# Patient Record
Sex: Female | Born: 1950 | Race: White | Hispanic: No | Smoking: Never smoker
Health system: Southern US, Community
[De-identification: ages and names within clinical notes are randomized; demographics above are authoritative.]

## PROBLEM LIST (undated history)

## (undated) DIAGNOSIS — I10 Essential (primary) hypertension: Secondary | ICD-10-CM

## (undated) DIAGNOSIS — E7800 Pure hypercholesterolemia, unspecified: Secondary | ICD-10-CM

---

## 2000-05-05 ENCOUNTER — Ambulatory Visit (HOSPITAL_COMMUNITY): Admission: RE | Admit: 2000-05-05 | Discharge: 2000-05-05 | Payer: Self-pay | Admitting: Internal Medicine

## 2000-05-05 ENCOUNTER — Encounter: Payer: Self-pay | Admitting: Internal Medicine

## 2020-06-07 ENCOUNTER — Emergency Department (HOSPITAL_BASED_OUTPATIENT_CLINIC_OR_DEPARTMENT_OTHER)
Admission: EM | Admit: 2020-06-07 | Discharge: 2020-06-08 | Disposition: A | Discharge status: Home / Self Care | Payer: Worker's Compensation | Attending: Emergency Medicine | Admitting: Emergency Medicine

## 2020-06-07 ENCOUNTER — Other Ambulatory Visit: Payer: Self-pay

## 2020-06-07 ENCOUNTER — Emergency Department (HOSPITAL_BASED_OUTPATIENT_CLINIC_OR_DEPARTMENT_OTHER): Payer: Worker's Compensation

## 2020-06-07 ENCOUNTER — Encounter (HOSPITAL_BASED_OUTPATIENT_CLINIC_OR_DEPARTMENT_OTHER): Payer: Self-pay | Admitting: *Deleted

## 2020-06-07 DIAGNOSIS — I10 Essential (primary) hypertension: Secondary | ICD-10-CM | POA: Diagnosis not present

## 2020-06-07 DIAGNOSIS — Y9301 Activity, walking, marching and hiking: Secondary | ICD-10-CM | POA: Insufficient documentation

## 2020-06-07 DIAGNOSIS — W010XXA Fall on same level from slipping, tripping and stumbling without subsequent striking against object, initial encounter: Secondary | ICD-10-CM | POA: Insufficient documentation

## 2020-06-07 DIAGNOSIS — S42211A Unspecified displaced fracture of surgical neck of right humerus, initial encounter for closed fracture: Secondary | ICD-10-CM | POA: Diagnosis not present

## 2020-06-07 DIAGNOSIS — Y990 Civilian activity done for income or pay: Secondary | ICD-10-CM | POA: Insufficient documentation

## 2020-06-07 DIAGNOSIS — S4991XA Unspecified injury of right shoulder and upper arm, initial encounter: Secondary | ICD-10-CM | POA: Diagnosis present

## 2020-06-07 HISTORY — DX: Essential (primary) hypertension: I10

## 2020-06-07 HISTORY — DX: Pure hypercholesterolemia, unspecified: E78.00

## 2020-06-07 MED ORDER — OXYCODONE-ACETAMINOPHEN 5-325 MG PO TABS
1.0000 | ORAL_TABLET | Freq: Once | ORAL | Status: AC
  Administered 2020-06-07: 1 via ORAL
  Filled 2020-06-07: qty 1

## 2020-06-07 MED ORDER — ONDANSETRON 4 MG PO TBDP
4.0000 mg | ORAL_TABLET | Freq: Once | ORAL | Status: AC
  Administered 2020-06-07: 4 mg via ORAL
  Filled 2020-06-07: qty 1

## 2020-06-07 NOTE — Discharge Instructions (Signed)
You can take Tylenol or Ibuprofen as directed for pain. You can alternate Tylenol and Ibuprofen every 4 hours. If you take Tylenol at 1pm, then you can take Ibuprofen at 5pm. Then you can take Tylenol again at 9pm.   Take pain medications as directed for break through pain. Do not drive or operate machinery while taking this medication.   Take zofran for nausea.  Follow up with the referred orthopedic doctor.   Return to the Emergency Dept for any worsening pain, numbness/weakness, discoloration or any other worsening or concerning symptoms.

## 2020-06-07 NOTE — ED Triage Notes (Signed)
Pt reports she works for [employer] and tripped on a furniture riser this evening and fell on her right arm. C/o pain in right wrist, elbow and shoulder. States EMS evaluated her but she came to ED by POV. Alert, able to wiggle fingers on right hand, + radial pulses present

## 2020-06-07 NOTE — ED Provider Notes (Signed)
[facility] EMERGENCY DEPARTMENT Provider Note   CSN: [id number] Arrival date & time: 06/07/20  2208     History Chief Complaint  Patient presents with  . Fall    Caitlin Torres is a 70 y.o. female past medical history of high cholesterol, hypertension who presents for evaluation of right upper extremity pain after mechanical fall that occurred earlier this evening.  Patient reports she was at work and states that she was walking out and tripped over a platform and states that she fell, landing on her right arm.  She states she denies any preceding chest pain or dizziness that caused her to fall.  She did not hit her head.  She denies any LOC.  She is not on any blood thinners.  She reports since then, she has had pain noted mainly to her right elbow, right wrist but states it radiates to her wrist, shoulder.  She has difficulty moving secondary to pain.  No numbness.  She has been able to ambulate since this happened.  She denies any neck pain, vision changes, nausea/vomiting, numbness/weakness.  The history is provided by the patient.       Past Medical History:  Diagnosis Date  . High cholesterol   . Hypertension     There are no problems to display for this patient.    The histories are not reviewed yet. Please review them in the "History" navigator section and refresh this SmartLink.   OB History   No obstetric history on file.     No family history on file.  Social History   Tobacco Use  . Smoking status: Never Smoker  . Smokeless tobacco: Never Used  Substance Use Topics  . Alcohol use: Not Currently    Comment: occas  . Drug use: Never    Home Medications Prior to Admission medications   Medication Sig Start Date End Date Taking? Authorizing Provider  HYDROcodone-acetaminophen (NORCO/VICODIN) 5-325 MG tablet Take 1-2 tablets by mouth every 6 (six) hours as needed. 06/08/20  Yes Graciella Freer A, PA-C  ondansetron (ZOFRAN ODT) 4 MG disintegrating  tablet Take 1 tablet (4 mg total) by mouth every 8 (eight) hours as needed for nausea or vomiting. 06/08/20  Yes Maxwell Caul, PA-C    Allergies    Patient has no known allergies.  Review of Systems   Review of Systems  Eyes: Negative for visual disturbance.  Respiratory: Negative for shortness of breath.   Cardiovascular: Negative for chest pain.  Gastrointestinal: Negative for abdominal pain, nausea and vomiting.  Musculoskeletal: Negative for neck pain.       RUE pain  Neurological: Negative for weakness, numbness and headaches.  All other systems reviewed and are negative.   Physical Exam Updated Vital Signs BP (!) 168/75   Pulse (!) 56   Temp 98.2 F (36.8 C) (Oral)   Resp 17   Ht 5\' 7"  (1.702 m)   Wt 70.3 kg   SpO2 100%   BMI 24.28 kg/m   Physical Exam Vitals and nursing note reviewed.  Constitutional:      Appearance: She is well-developed.  HENT:     Head: Normocephalic and atraumatic.     Comments: No tenderness to palpation of skull. No deformities or crepitus noted. No open wounds, abrasions or lacerations.  Eyes:     General: No scleral icterus.       Right eye: No discharge.        Left eye: No discharge.  Conjunctiva/sclera: Conjunctivae normal.     Comments: PERRL. EOMs intact. No nystagmus. No neglect.   Neck:     Comments: Full flexion/extension and lateral movement of neck fully intact. No bony midline tenderness. No deformities or crepitus.  Cardiovascular:     Pulses:          Radial pulses are 2+ on the right side and 2+ on the left side.  Pulmonary:     Effort: Pulmonary effort is normal.  Musculoskeletal:     Comments: Tenderness palpation noted to right elbow.  Limited range of motion secondary to pain.  No deformity or crepitus noted.  Tenderness palpation in the radial aspect of the right wrist.  Limited range of motion secondary to pain.  No deformity or crepitus noted.  She can wiggle all 5 digits.  She has some diffuse tenderness  noted to the right shoulder.  Limited tremor secondary to pain.  No deformity crepitus noted.  No bony tenderness in his left upper extremity.  No pelvic instability. No tenderness to palpation to bilateral knees and ankles. No deformities or crepitus noted. FROM of BLE without any difficulty.   Skin:    General: Skin is warm and dry.  Neurological:     Mental Status: She is alert.     Comments: Cranial nerves III-XII intact Follows commands, Moves all extremities  5/5 strength to LUE and BLE. Limited evaluation of RUE secondary to pain.  Sensation intact throughout all major nerve distributions No slurred speech. No facial droop.   Psychiatric:        Speech: Speech normal.        Behavior: Behavior normal.     ED Results / Procedures / Treatments   Labs (all labs ordered are listed, but only abnormal results are displayed) Labs Reviewed - No data to display  EKG None  Radiology DG Shoulder Right  Result Date: 06/07/2020 CLINICAL DATA:  Tripped and fell, right arm pain EXAM: RIGHT SHOULDER - 2+ VIEW COMPARISON:  None. FINDINGS: Internal rotation, external rotation, transscapular views of the right shoulder demonstrate a comminuted impacted right humeral neck fracture. Slight inferior subluxation of the humeral head within the glenoid fossa consistent with joint effusion. No evidence of dislocation. Moderate osteoarthritis of the glenohumeral joint. The right chest is clear. IMPRESSION: 1. Comminuted impacted right humeral neck fracture, with evidence of joint effusion. 2. Glenohumeral osteoarthritis. Electronically Signed   By: Sharlet Salina M.D.   On: 06/07/2020 23:40   DG Elbow Complete Right  Result Date: 06/07/2020 CLINICAL DATA:  Tripped and fell, right elbow pain EXAM: RIGHT ELBOW - COMPLETE 3+ VIEW COMPARISON:  None. FINDINGS: Frontal, bilateral oblique, lateral views of the right elbow are obtained. Evaluation is limited due to suboptimal positioning as result of the  patient's proximal humeral fracture. No evidence of acute fracture, subluxation, or dislocation about the right elbow. No joint effusion. Soft tissues are unremarkable. IMPRESSION: 1. Unremarkable evaluation of the right elbow, limited by suboptimal positioning. Electronically Signed   By: Sharlet Salina M.D.   On: 06/07/2020 23:41   DG Wrist Complete Right  Result Date: 06/07/2020 CLINICAL DATA:  Fall EXAM: RIGHT WRIST - COMPLETE 3+ VIEW COMPARISON:  None. FINDINGS: There is no evidence of fracture or dislocation. There is no evidence of arthropathy or other focal bone abnormality. Soft tissues are unremarkable. IMPRESSION: Negative. Electronically Signed   By: Deatra Robinson M.D.   On: 06/07/2020 23:40    Procedures Procedures   Medications Ordered in  ED Medications  ondansetron (ZOFRAN-ODT) disintegrating tablet 4 mg (4 mg Oral Given 06/07/20 2304)  oxyCODONE-acetaminophen (PERCOCET/ROXICET) 5-325 MG per tablet 1 tablet (1 tablet Oral Given 06/07/20 2304)    ED Course  I have reviewed the triage vital signs and the nursing notes.  Pertinent labs & imaging results that were available during my care of the patient were reviewed by me and considered in my medical decision making (see chart for details).    MDM Rules/Calculators/A&P                          70 year old female who presents for evaluation after mechanical fall that occurred earlier this evening.  She reports that she tripped over a platform at work, causing her to land on her right shoulder.  No head injury, LOC.  She is not on blood thinners.  On initial arrival, she is afebrile, nontoxic-appearing.  Vital signs are stable.  She has tenderness palpation noted to the right elbow, right wrist and the right shoulder.  She is neurovascularly intact.  Patient states she is on blood thinners.  Denies any head injury, LOC.  Neuro exam is is normal here in the ED.  We will plan for imaging of her right upper extremity.  X-ray shows a  comminuted impacted right humeral neck fracture with evidence of joint effusion.  Elbow x-ray, wrist x-ray negative for any acute abnormalities.  Discussed results with patient.  We will plan to put in a sling immobilizer.  Patient instructed to follow-up with Ortho.  Patient given a short course of pain medication for acute/breakthrough pain. At this time, patient exhibits no emergent life-threatening condition that require further evaluation in ED. Patient had ample opportunity for questions and discussion. All patient's questions were answered with full understanding. Strict return precautions discussed. Patient expresses understanding and agreement to plan.   Portions of this note were generated with Scientist, clinical (histocompatibility and immunogenetics). Dictation errors may occur despite best attempts at proofreading.   Final Clinical Impression(s) / ED Diagnoses Final diagnoses:  Closed fracture of neck of right humerus, initial encounter    Rx / DC Orders ED Discharge Orders         Ordered    HYDROcodone-acetaminophen (NORCO/VICODIN) 5-325 MG tablet  Every 6 hours PRN        06/08/20 0001    ondansetron (ZOFRAN ODT) 4 MG disintegrating tablet  Every 8 hours PRN        06/08/20 0001           Maxwell Caul, PA-C 06/08/20 0014    Melene Plan, DO 06/08/20 2304

## 2020-06-08 MED ORDER — ONDANSETRON 4 MG PO TBDP: 4.0000 mg | ORAL_TABLET | Freq: Three times a day (TID) | ORAL | 0 refills | Status: AC | PRN

## 2020-06-08 MED ORDER — HYDROCODONE-ACETAMINOPHEN 5-325 MG PO TABS: 1.0000 | ORAL_TABLET | Freq: Four times a day (QID) | ORAL | 0 refills | Status: AC | PRN

## 2020-09-24 ENCOUNTER — Other Ambulatory Visit: Payer: Self-pay

## 2020-09-24 ENCOUNTER — Emergency Department (HOSPITAL_BASED_OUTPATIENT_CLINIC_OR_DEPARTMENT_OTHER)
Admission: EM | Admit: 2020-09-24 | Discharge: 2020-09-24 | Disposition: A | Discharge status: Home / Self Care | Payer: Medicare PPO | Attending: Emergency Medicine | Admitting: Emergency Medicine

## 2020-09-24 ENCOUNTER — Emergency Department (HOSPITAL_BASED_OUTPATIENT_CLINIC_OR_DEPARTMENT_OTHER): Payer: Medicare PPO

## 2020-09-24 ENCOUNTER — Encounter (HOSPITAL_BASED_OUTPATIENT_CLINIC_OR_DEPARTMENT_OTHER): Payer: Self-pay | Admitting: Emergency Medicine

## 2020-09-24 ENCOUNTER — Other Ambulatory Visit (HOSPITAL_BASED_OUTPATIENT_CLINIC_OR_DEPARTMENT_OTHER): Payer: Self-pay

## 2020-09-24 DIAGNOSIS — K029 Dental caries, unspecified: Secondary | ICD-10-CM | POA: Diagnosis not present

## 2020-09-24 DIAGNOSIS — Z79899 Other long term (current) drug therapy: Secondary | ICD-10-CM | POA: Diagnosis not present

## 2020-09-24 DIAGNOSIS — R0981 Nasal congestion: Secondary | ICD-10-CM | POA: Diagnosis present

## 2020-09-24 DIAGNOSIS — U071 COVID-19: Secondary | ICD-10-CM | POA: Diagnosis not present

## 2020-09-24 DIAGNOSIS — I10 Essential (primary) hypertension: Secondary | ICD-10-CM | POA: Diagnosis not present

## 2020-09-24 LAB — RESP PANEL BY RT-PCR (FLU A&B, COVID) ARPGX2
Influenza A by PCR: NEGATIVE
Influenza B by PCR: NEGATIVE
SARS Coronavirus 2 by RT PCR: POSITIVE — AB

## 2020-09-24 MED ORDER — ACETAMINOPHEN 325 MG PO TABS
650.0000 mg | ORAL_TABLET | Freq: Once | ORAL | Status: AC
  Administered 2020-09-24: 650 mg via ORAL
  Filled 2020-09-24: qty 2

## 2020-09-24 MED ORDER — MOLNUPIRAVIR EUA 200MG CAPSULE
4.0000 | ORAL_CAPSULE | Freq: Two times a day (BID) | ORAL | 0 refills | Status: AC
  Filled 2020-09-24: qty 40, 5d supply, fill #0

## 2020-09-24 NOTE — ED Provider Notes (Signed)
[facility] EMERGENCY DEPARTMENT Provider Note   CSN: [id number] Arrival date & time: 09/24/20  0758     History   Caitlin Torres is a 70 y.o. female.  Patient presents to the ED with complaints of facial pain, sore throat, headache neck pain sinus congestion since last evening.  Patient is having cough and congestion.  She is not having any fevers.  She denies any body aches.  She is also having pain in her right posterior upper molar region.  She has had a tooth issue there for a while that she has ignored.  Patient is not sure if she is having issues with the teeth that is causing her headache.  She also wonders if she has covid because of her symptoms.  She has been vaccinated.  She has not done any home COVID testing     Past Medical History:  Diagnosis Date   High cholesterol    Hypertension     There are no problems to display for this patient.   Past Surgical History:  Procedure Laterality Date   CESAREAN SECTION       OB History   No obstetric history on file.     History reviewed. No pertinent family history.  Social History   Tobacco Use   Smoking status: Never   Smokeless tobacco: Never  Substance Use Topics   Alcohol use: Not Currently    Comment: occas   Drug use: Never    Home Medications Prior to Admission medications   Medication Sig Start Date End Date Taking? Authorizing Provider  LOSARTAN POTASSIUM-HCTZ PO Take by mouth.   Yes [provider]  molnupiravir EUA 200 mg CAPS Take 4 capsules (800 mg total) by mouth 2 (two) times daily for 5 days. 09/24/20 09/29/20 Yes Linwood Dibbles, MD  pravastatin (PRAVACHOL) 40 MG tablet Take 40 mg by mouth daily. 07/23/20  Yes [provider]  HYDROcodone-acetaminophen (NORCO/VICODIN) 5-325 MG tablet Take 1-2 tablets by mouth every 6 (six) hours as needed. 06/08/20   Maxwell Caul, PA-C  ondansetron (ZOFRAN ODT) 4 MG disintegrating tablet Take 1 tablet (4 mg total) by mouth every 8  (eight) hours as needed for nausea or vomiting. 06/08/20   Maxwell Caul, PA-C    Allergies    Patient has no known allergies.  Review of Systems   Review of Systems  All other systems reviewed and are negative.  Physical Exam Updated Vital Signs BP (!) 150/73 (BP Location: Left Arm)   Pulse 67   Temp 98.7 F (37.1 C) (Oral)   Resp 18   Ht 1.702 m (5\' 7" )   Wt 77.5 kg   SpO2 95%   BMI 26.77 kg/m   Physical Exam Vitals and nursing note reviewed.  Constitutional:      General: She is not in acute distress.    Appearance: She is well-developed.  HENT:     Head: Normocephalic and atraumatic.     Comments: Fractured and decayed tooth right posterior upper molar, no purulent drainage    Right Ear: Tympanic membrane and external ear normal.     Left Ear: Tympanic membrane and external ear normal.     Nose: No rhinorrhea.  Eyes:     General: No scleral icterus.       Right eye: No discharge.        Left eye: No discharge.     Conjunctiva/sclera: Conjunctivae normal.  Neck:     Trachea: No tracheal  deviation.  Cardiovascular:     Rate and Rhythm: Normal rate and regular rhythm.  Pulmonary:     Effort: Pulmonary effort is normal. No respiratory distress.     Breath sounds: Normal breath sounds. No stridor. No wheezing or rales.  Abdominal:     General: Bowel sounds are normal. There is no distension.     Palpations: Abdomen is soft.     Tenderness: There is no abdominal tenderness. There is no guarding or rebound.  Musculoskeletal:        General: No tenderness or deformity.     Cervical back: Normal range of motion and neck supple. No rigidity.  Skin:    General: Skin is warm and dry.     Findings: No rash.  Neurological:     General: No focal deficit present.     Mental Status: She is alert.     Cranial Nerves: No cranial nerve deficit (no facial droop, extraocular movements intact, no slurred speech).     Sensory: No sensory deficit.     Motor: No abnormal  muscle tone or seizure activity.     Coordination: Coordination normal.  Psychiatric:        Mood and Affect: Mood normal.    ED Results / Procedures / Treatments   Labs (all labs ordered are listed, but only abnormal results are displayed) Labs Reviewed  RESP PANEL BY RT-PCR (FLU A&B, COVID) ARPGX2 - Abnormal; Notable for the following components:      Result Value   SARS Coronavirus 2 by RT PCR POSITIVE (*)    All other components within normal limits    EKG None  Radiology DG Chest Portable 1 View  Result Date: 09/24/2020 CLINICAL DATA:  Cough EXAM: PORTABLE CHEST 1 VIEW COMPARISON:  None. FINDINGS: The heart size and mediastinal contours are within normal limits. Both lungs are clear. The visualized skeletal structures are unremarkable. IMPRESSION: No active disease. Electronically Signed   By: Alcide Clever M.D.   On: 09/24/2020 09:19    Procedures Procedures   Medications Ordered in ED Medications  acetaminophen (TYLENOL) tablet 650 mg (650 mg Oral Given 09/24/20 7342)    ED Course  I have reviewed the triage vital signs and the nursing notes.  Pertinent labs & imaging results that were available during my care of the patient were reviewed by me and considered in my medical decision making (see chart for details).  Clinical Course as of 09/24/20 1027  Wed Sep 24, 2020  1014 Patient's COVID test is positive [JK]    Clinical Course User Index [JK] Linwood Dibbles, MD   MDM Rules/Calculators/A&P                          Patient presented with myalgias URI symptoms as well as dental caries.  Patient's exam is reassuring.  She does not have any respiratory difficulty.  No oxygen requirement.  Chest x-ray does not show pneumonia.  Patient does have dental caries but there is no focal swelling or erythema.  I doubt that is related to her symptoms.  Patient's COVID test is positive.  She initially told me she was vaccinated but she only received one of her vaccine doses.  She  did not follow-up for subsequent doses.  I do not see any renal function results on file for the last several years.  I will prescribe her molnupiravir.  Warning signs and precautions discussed Final Clinical Impression(s) / ED Diagnoses  Final diagnoses:  Dental caries  COVID-19 virus infection    Rx / DC Orders ED Discharge Orders          Ordered    molnupiravir EUA 200 mg CAPS  2 times daily        09/24/20 1027             Linwood Dibbles, MD 09/24/20 1029

## 2020-09-24 NOTE — ED Triage Notes (Signed)
Pt reports has let a tooth "go for awhile without checking." Pt reports right upper dental pain, facial pain, sore throat, headache, and neck pain since last night.

## 2020-09-24 NOTE — Discharge Instructions (Addendum)
The molnupiravir medication is an antiviral to help with your covid infection.  You may continue to have aches and pains and fevers.  Return to hospital if you start having trouble with shortness of breath/difficulty breathing.  Remained quarantine for 10 days.  To follow-up with a dentist once your infection clears

## 2020-09-24 NOTE — ED Notes (Signed)
XR at bedside

## 2020-12-25 ENCOUNTER — Emergency Department (HOSPITAL_BASED_OUTPATIENT_CLINIC_OR_DEPARTMENT_OTHER)
Admission: EM | Admit: 2020-12-25 | Discharge: 2020-12-25 | Disposition: A | Discharge status: Home / Self Care | Payer: Medicare Other | Attending: Emergency Medicine | Admitting: Emergency Medicine

## 2020-12-25 ENCOUNTER — Encounter (HOSPITAL_BASED_OUTPATIENT_CLINIC_OR_DEPARTMENT_OTHER): Payer: Self-pay | Admitting: Emergency Medicine

## 2020-12-25 ENCOUNTER — Other Ambulatory Visit: Payer: Self-pay

## 2020-12-25 DIAGNOSIS — Z79899 Other long term (current) drug therapy: Secondary | ICD-10-CM | POA: Diagnosis not present

## 2020-12-25 DIAGNOSIS — R21 Rash and other nonspecific skin eruption: Secondary | ICD-10-CM | POA: Insufficient documentation

## 2020-12-25 DIAGNOSIS — H5789 Other specified disorders of eye and adnexa: Secondary | ICD-10-CM

## 2020-12-25 DIAGNOSIS — I10 Essential (primary) hypertension: Secondary | ICD-10-CM | POA: Diagnosis not present

## 2020-12-25 DIAGNOSIS — R609 Edema, unspecified: Secondary | ICD-10-CM | POA: Insufficient documentation

## 2020-12-25 MED ORDER — FLUORESCEIN SODIUM 1 MG OP STRP
1.0000 | ORAL_STRIP | Freq: Once | OPHTHALMIC | Status: AC
  Administered 2020-12-25: 1 via OPHTHALMIC
  Filled 2020-12-25: qty 1

## 2020-12-25 MED ORDER — CEPHALEXIN 500 MG PO CAPS: 500.0000 mg | ORAL_CAPSULE | Freq: Four times a day (QID) | ORAL | 0 refills | Status: AC

## 2020-12-25 MED ORDER — FLUORESCEIN-BENOXINATE 0.25-0.4 % OP SOLN: 1.0000 [drp] | Freq: Once | OPHTHALMIC | Status: DC

## 2020-12-25 MED ORDER — TETRACAINE HCL 0.5 % OP SOLN
1.0000 [drp] | Freq: Once | OPHTHALMIC | Status: AC
  Administered 2020-12-25: 1 [drp] via OPHTHALMIC
  Filled 2020-12-25: qty 4

## 2020-12-25 MED ORDER — ACYCLOVIR 400 MG PO TABS: 800.0000 mg | ORAL_TABLET | Freq: Every day | ORAL | 0 refills | Status: AC

## 2020-12-25 MED ORDER — HYDROXYZINE HCL 25 MG PO TABS: 25.0000 mg | ORAL_TABLET | Freq: Four times a day (QID) | ORAL | 0 refills | Status: AC

## 2020-12-25 NOTE — ED Provider Notes (Signed)
[facility] EMERGENCY DEPARTMENT Provider Note   CSN: [id number] Arrival date & time: 12/25/20  1006     History Chief Complaint  Patient presents with   Rash    face    Caitlin Torres is a 70 y.o. female presents to the ED for left-sided facial rash and eye swelling since yesterday morning.  Patient reports the facial rash is burning and itching, but not painful.  She denies any new medications, lotions, soaps, perfumes, new foods, or outdoor time.  She has been using the same facial regimen for the past few months.  The patient denies any trouble swallowing, shortness of breath, swollen tongue, chest pain, abdominal pain, nausea, or vomiting.  The patient reports she applied topical hydrocortisone cream and the color has improved.  However, the patient mentions her eye swelling has increased this morning and eye itching.  No discharge or pain from the eye.  No photophobia.  No blurry vision.  Reports medical history includes hypertension and hyperlipidemia.   Rash Associated symptoms: no abdominal pain, no diarrhea, no fever, no joint pain, no nausea, no shortness of breath, no sore throat and not vomiting       Past Medical History:  Diagnosis Date   High cholesterol    Hypertension     There are no problems to display for this patient.   Past Surgical History:  Procedure Laterality Date   CESAREAN SECTION       OB History   No obstetric history on file.     No family history on file.  Social History   Tobacco Use   Smoking status: Never   Smokeless tobacco: Never  Substance Use Topics   Alcohol use: Not Currently    Comment: occas   Drug use: Never    Home Medications Prior to Admission medications   Medication Sig Start Date End Date Taking? Authorizing Provider  acyclovir (ZOVIRAX) 400 MG tablet Take 2 tablets (800 mg total) by mouth 5 (five) times daily. 12/25/20  Yes Achille Rich, PA-C  cephALEXin (KEFLEX) 500 MG capsule Take 1 capsule (500  mg total) by mouth 4 (four) times daily. 12/25/20  Yes Achille Rich, PA-C  hydrOXYzine (ATARAX/VISTARIL) 25 MG tablet Take 1 tablet (25 mg total) by mouth every 6 (six) hours. 12/25/20  Yes Achille Rich, PA-C  HYDROcodone-acetaminophen (NORCO/VICODIN) 5-325 MG tablet Take 1-2 tablets by mouth every 6 (six) hours as needed. 06/08/20   Maxwell Caul, PA-C  LOSARTAN POTASSIUM-HCTZ PO Take by mouth.    [provider]  ondansetron (ZOFRAN ODT) 4 MG disintegrating tablet Take 1 tablet (4 mg total) by mouth every 8 (eight) hours as needed for nausea or vomiting. 06/08/20   Maxwell Caul, PA-C  pravastatin (PRAVACHOL) 40 MG tablet Take 40 mg by mouth daily. 07/23/20   [provider]    Allergies    Patient has no known allergies.  Review of Systems   Review of Systems  Constitutional:  Negative for chills and fever.  HENT:  Positive for facial swelling. Negative for congestion, ear pain, rhinorrhea, sore throat and trouble swallowing.   Eyes:  Positive for itching. Negative for photophobia, pain, discharge, redness and visual disturbance.  Respiratory:  Negative for cough and shortness of breath.   Cardiovascular:  Negative for chest pain and palpitations.  Gastrointestinal:  Negative for abdominal pain, constipation, diarrhea, nausea and vomiting.  Genitourinary:  Negative for dysuria and hematuria.  Musculoskeletal:  Negative for arthralgias and back pain.  Skin:  Positive for rash. Negative for color change.  Neurological:  Negative for seizures and syncope.  All other systems reviewed and are negative.  Physical Exam Updated Vital Signs BP (!) 151/74 (BP Location: Left Arm)   Pulse (!) 56   Temp 98.2 F (36.8 C)   Resp 14   Ht 5\' 8"  (1.727 m)   Wt 77.1 kg   SpO2 100%   BMI 25.85 kg/m   Physical Exam Vitals and nursing note reviewed.  Constitutional:      General: She is not in acute distress.    Appearance: Normal appearance. She is not toxic-appearing.   HENT:     Head: Normocephalic and atraumatic.     Mouth/Throat:     Comments: Airway patent Eyes:     General: No scleral icterus.       Right eye: No discharge.        Left eye: No discharge.     Extraocular Movements: Extraocular movements intact.     Pupils: Pupils are equal, round, and reactive to light.     Comments: No erythema.  Conjunctiva normal.  No discharge noted.  No corneal abrasion or ulceration seen with fluorescein and Woods lamp.  Moderate swelling and erythema to upper and lower lid.  Not tender to palpation.  Swelling is soft.  Vision intact bilaterally.  Cardiovascular:     Rate and Rhythm: Normal rate and regular rhythm.  Pulmonary:     Effort: Pulmonary effort is normal. No respiratory distress.     Breath sounds: Normal breath sounds.     Comments: Patient speaking in full sentences without difficulty.  Abdominal:     General: Abdomen is flat. Bowel sounds are normal.     Palpations: Abdomen is soft.  Musculoskeletal:        General: No deformity.     Cervical back: Normal range of motion.  Lymphadenopathy:     Cervical: No cervical adenopathy.  Skin:    General: Skin is warm and dry.     Findings: Erythema and rash present.     Comments: Area of clear, collated vesicular appearing rash with erythematous papules above the right eyebrow medially. Larger area of clear, collated vesicular appearing rash with erythematous base to the left cheek. Urticaria present down the left side of neck. No skin sloughing noted. No weeping or discharge noted from rash.   Neurological:     General: No focal deficit present.     Mental Status: She is alert. Mental status is at baseline.     Sensory: No sensory deficit.    ED Results / Procedures / Treatments   Labs (all labs ordered are listed, but only abnormal results are displayed) Labs Reviewed - No data to display  EKG None  Radiology No results found.  Procedures Procedures   Medications Ordered in  ED Medications  fluorescein ophthalmic strip 1 strip (1 strip Left Eye Given by Other 12/25/20 1433)  tetracaine (PONTOCAINE) 0.5 % ophthalmic solution 1 drop (1 drop Left Eye Given by Other 12/25/20 1432)    ED Course  I have reviewed the triage vital signs and the nursing notes.  Pertinent labs & imaging results that were available during my care of the patient were reviewed by me and considered in my medical decision making (see chart for details).  BRIELLAH BAIK is a 70 year old female presenting for left-sided facial rash and eye swelling. Differential diagnosis includes but is not limited to allergic reaction, contact dermatitis,  atopic dermatitis, shingles. Physical exam reassuring for no systemic signs of anaphylaxis.   Fluorescein strip and tetracaine placed in left eye. Exam benign, no ulceration, abrasion, lesion, or uptake of dye noted. The rash presents differently on different areas of the face and does not follow a dermatomal pattern. Due to the difficult and mixed presentation of the rash, I discussed this with my attending who assessed the patient at bedside. It was decided to treat with Keflex for possible cellulitis and acyclovir for possible shingles. The patient reported itching and was also given hydroxyzine. I informed her not to take Benadryl or her Xanax with this medication. Strict return precautions discussed with patient. Patient agrees to plan. Patient is stable and being discharged home in good condition.    I discussed this case with my attending physician who cosigned this note including patient's presenting symptoms, physical exam, and planned diagnostics and interventions. Attending physician stated agreement with plan or made changes to plan which were implemented.   MDM Rules/Calculators/A&P                         Final Clinical Impression(s) / ED Diagnoses Final diagnoses:  Rash  Eye swelling, left    Rx / DC Orders ED Discharge Orders          Ordered     cephALEXin (KEFLEX) 500 MG capsule  4 times daily        12/25/20 1504    acyclovir (ZOVIRAX) 400 MG tablet  5 times daily        12/25/20 1504    hydrOXYzine (ATARAX/VISTARIL) 25 MG tablet  Every 6 hours        12/25/20 1504             Achille Rich, [place] 12/26/20 2307    Tegeler, Canary Brim, MD 12/27/20 [phone]

## 2020-12-25 NOTE — Discharge Instructions (Addendum)
You are seen here today for left-sided facial rash and left eye swelling.  Given the presentation of the rash, you have been prescribed acyclovir and Keflex for symptoms. Additionally, you have been prescribed hydroxyzine to help with itching as needed.  Try not to scratch the area as you may cause it to spread.  You can take Tylenol or ibuprofen for pain.  If you have any change in your symptoms such as loss of vision, increasing eye swelling, fever, new or worsening symptoms, please return to your nearest ED.

## 2020-12-25 NOTE — ED Triage Notes (Signed)
Burning and itching facial rash , swelling to right side of face. no drainage

## 2021-03-04 ENCOUNTER — Other Ambulatory Visit: Payer: Self-pay

## 2021-03-04 ENCOUNTER — Emergency Department (HOSPITAL_BASED_OUTPATIENT_CLINIC_OR_DEPARTMENT_OTHER)
Admission: EM | Admit: 2021-03-04 | Discharge: 2021-03-04 | Disposition: A | Discharge status: Home / Self Care | Payer: Medicare Other | Attending: Emergency Medicine | Admitting: Emergency Medicine

## 2021-03-04 ENCOUNTER — Encounter (HOSPITAL_BASED_OUTPATIENT_CLINIC_OR_DEPARTMENT_OTHER): Payer: Self-pay

## 2021-03-04 DIAGNOSIS — Z20822 Contact with and (suspected) exposure to covid-19: Secondary | ICD-10-CM | POA: Diagnosis not present

## 2021-03-04 DIAGNOSIS — I10 Essential (primary) hypertension: Secondary | ICD-10-CM | POA: Diagnosis not present

## 2021-03-04 DIAGNOSIS — Z79899 Other long term (current) drug therapy: Secondary | ICD-10-CM | POA: Insufficient documentation

## 2021-03-04 DIAGNOSIS — J101 Influenza due to other identified influenza virus with other respiratory manifestations: Secondary | ICD-10-CM | POA: Diagnosis not present

## 2021-03-04 DIAGNOSIS — Z8616 Personal history of COVID-19: Secondary | ICD-10-CM | POA: Diagnosis not present

## 2021-03-04 DIAGNOSIS — R059 Cough, unspecified: Secondary | ICD-10-CM | POA: Diagnosis present

## 2021-03-04 LAB — RESP PANEL BY RT-PCR (FLU A&B, COVID) ARPGX2
Influenza A by PCR: POSITIVE — AB
Influenza B by PCR: NEGATIVE
SARS Coronavirus 2 by RT PCR: NEGATIVE

## 2021-03-04 NOTE — ED Triage Notes (Signed)
Pt c/o flu like sx x 3 days-states her PCP called in abx and prednisone-NAD-steady gait

## 2021-03-04 NOTE — ED Provider Notes (Signed)
[facility] EMERGENCY DEPARTMENT Provider Note   CSN: [id number] Arrival date & time: 03/04/21  1136     History Chief Complaint  Patient presents with   Cough    Caitlin Torres is a 70 y.o. female who presents with 3 days of nasal congestion, headache, sore throat, and low-grade fever after large social events over the weekend.  Denies any nausea, vomiting, diarrhea, chest pain, or shortness of breath.  She has history of hypertension and high cholesterol, is not on any anticoagulation.  HPI     Past Medical History:  Diagnosis Date   High cholesterol    Hypertension     There are no problems to display for this patient.   Past Surgical History:  Procedure Laterality Date   CESAREAN SECTION       OB History   No obstetric history on file.     No family history on file.  Social History   Tobacco Use   Smoking status: Never   Smokeless tobacco: Never  Vaping Use   Vaping Use: Never used  Substance Use Topics   Alcohol use: Not Currently   Drug use: Never    Home Medications Prior to Admission medications   Medication Sig Start Date End Date Taking? Authorizing Provider  acyclovir (ZOVIRAX) 400 MG tablet Take 2 tablets (800 mg total) by mouth 5 (five) times daily. 12/25/20   Achille Rich, PA-C  cephALEXin (KEFLEX) 500 MG capsule Take 1 capsule (500 mg total) by mouth 4 (four) times daily. 12/25/20   Achille Rich, PA-C  HYDROcodone-acetaminophen (NORCO/VICODIN) 5-325 MG tablet Take 1-2 tablets by mouth every 6 (six) hours as needed. 06/08/20   Maxwell Caul, PA-C  hydrOXYzine (ATARAX/VISTARIL) 25 MG tablet Take 1 tablet (25 mg total) by mouth every 6 (six) hours. 12/25/20   Achille Rich, PA-C  LOSARTAN POTASSIUM-HCTZ PO Take by mouth.    [provider]  ondansetron (ZOFRAN ODT) 4 MG disintegrating tablet Take 1 tablet (4 mg total) by mouth every 8 (eight) hours as needed for nausea or vomiting. 06/08/20   Maxwell Caul, PA-C   pravastatin (PRAVACHOL) 40 MG tablet Take 40 mg by mouth daily. 07/23/20   [provider]    Allergies    Patient has no known allergies.  Review of Systems   Review of Systems  Constitutional:  Positive for activity change, appetite change, chills, fatigue and fever.  HENT:  Positive for congestion, rhinorrhea and sore throat. Negative for trouble swallowing.   Eyes: Negative.   Respiratory:  Positive for cough. Negative for shortness of breath.   Cardiovascular: Negative.   Gastrointestinal: Negative.   Musculoskeletal:  Positive for myalgias.  Skin: Negative.   Neurological:  Positive for headaches. Negative for syncope and weakness.   Physical Exam Updated Vital Signs BP (!) 177/76 (BP Location: Left Arm)   Pulse 73   Temp 99.7 F (37.6 C) (Oral)   Resp 20   Ht 5\' 7"  (1.702 m)   Wt 68 kg   SpO2 99%   BMI 23.49 kg/m   Physical Exam Vitals and nursing note reviewed.  Constitutional:      Appearance: She is not ill-appearing or toxic-appearing.  HENT:     Head: Normocephalic and atraumatic.     Nose: Congestion present.     Mouth/Throat:     Mouth: Mucous membranes are moist.     Pharynx: No oropharyngeal exudate or posterior oropharyngeal erythema.  Eyes:     General:  Right eye: No discharge.        Left eye: No discharge.     Extraocular Movements: Extraocular movements intact.     Conjunctiva/sclera: Conjunctivae normal.     Pupils: Pupils are equal, round, and reactive to light.  Cardiovascular:     Rate and Rhythm: Normal rate and regular rhythm.     Pulses: Normal pulses.     Heart sounds: Normal heart sounds. No murmur heard. Pulmonary:     Effort: Pulmonary effort is normal. No tachypnea, bradypnea, accessory muscle usage, prolonged expiration or respiratory distress.     Breath sounds: Normal breath sounds. No wheezing or rales.  Abdominal:     General: Bowel sounds are normal. There is no distension.     Palpations: Abdomen is  soft.     Tenderness: There is no abdominal tenderness.  Musculoskeletal:        General: No deformity.     Cervical back: Neck supple. No tenderness.     Right lower leg: No edema.     Left lower leg: No edema.  Lymphadenopathy:     Cervical: No cervical adenopathy.  Skin:    General: Skin is warm and dry.     Capillary Refill: Capillary refill takes less than 2 seconds.  Neurological:     General: No focal deficit present.     Mental Status: She is alert and oriented to person, place, and time. Mental status is at baseline.  Psychiatric:        Mood and Affect: Mood normal.    ED Results / Procedures / Treatments   Labs (all labs ordered are listed, but only abnormal results are displayed) Labs Reviewed  RESP PANEL BY RT-PCR (FLU A&B, COVID) ARPGX2 - Abnormal; Notable for the following components:      Result Value   Influenza A by PCR POSITIVE (*)    All other components within normal limits    EKG None  Radiology No results found.  Procedures Procedures   Medications Ordered in ED Medications - No data to display  ED Course  I have reviewed the triage vital signs and the nursing notes.  Pertinent labs & imaging results that were available during my care of the patient were reviewed by me and considered in my medical decision making (see chart for details).    MDM Rules/Calculators/A&P                         70 year old female who presents with concern for 3 days of flulike symptoms.  Hypertensive on intake, presents otherwise normal.  Cardiopulmonary exam is normal, abdominal exam is benign.  Patient with nasal congestion and mild posterior pharyngeal erythema without exudate or signs concerning for oropharyngeal abscess.  Patient is neurovascular intact in all 4 extremities and cognitively intact.  She has tested positive for influenza A on her respiratory pathogen panel; as this is a for symptoms for her, Tamiflu was not offered.  She is tolerating p.o. in  the emergency department and ambulatory, well-appearing.  No further work-up warranted in the ER at this time.  [place] voiced understanding of her medical evaluation and treatment plan.  Her questions were answered to her expressed satisfaction.  Return precautions were given.  Patient is well-appearing, stable, and appropriate for discharge at this time.  This chart was dictated using voice recognition software, Dragon. Despite the best efforts of this provider to proofread and correct errors, errors may still occur which  can change documentation meaning.  Final Clinical Impression(s) / ED Diagnoses Final diagnoses:  Influenza A    Rx / DC Orders ED Discharge Orders     None        Sherrilee Gilles 03/04/21 2120    Terrilee Files, MD 03/05/21 [phone]

## 2021-03-04 NOTE — ED Notes (Signed)
Called to triage for RT assessment, BBS CTA, no distress noted, speaking in complete sentences.

## 2021-03-04 NOTE — ED Notes (Signed)
ED Provider at bedside. 

## 2021-03-04 NOTE — ED Notes (Signed)
Pt given hot tea and snack per request.

## 2021-03-04 NOTE — Discharge Instructions (Signed)
You tested positive for influenza.  Please continue to increase her hydration at home, get plenty of rest, and use Tylenol as needed for fever or discomfort.  The antibiotic you were prescribed yesterday will not improve your symptoms secondary to influenza therefore you do not need to keep taking it.  Follow-up with your primary care doctor and return to the ER with any new severe symptoms.

## 2022-09-12 IMAGING — DX DG CHEST 1V PORT
1 series · 1 of 1 positions shown · non-contrast
Comparison: None.

CLINICAL DATA: Cough

EXAM:
PORTABLE CHEST 1 VIEW

[chest ap]
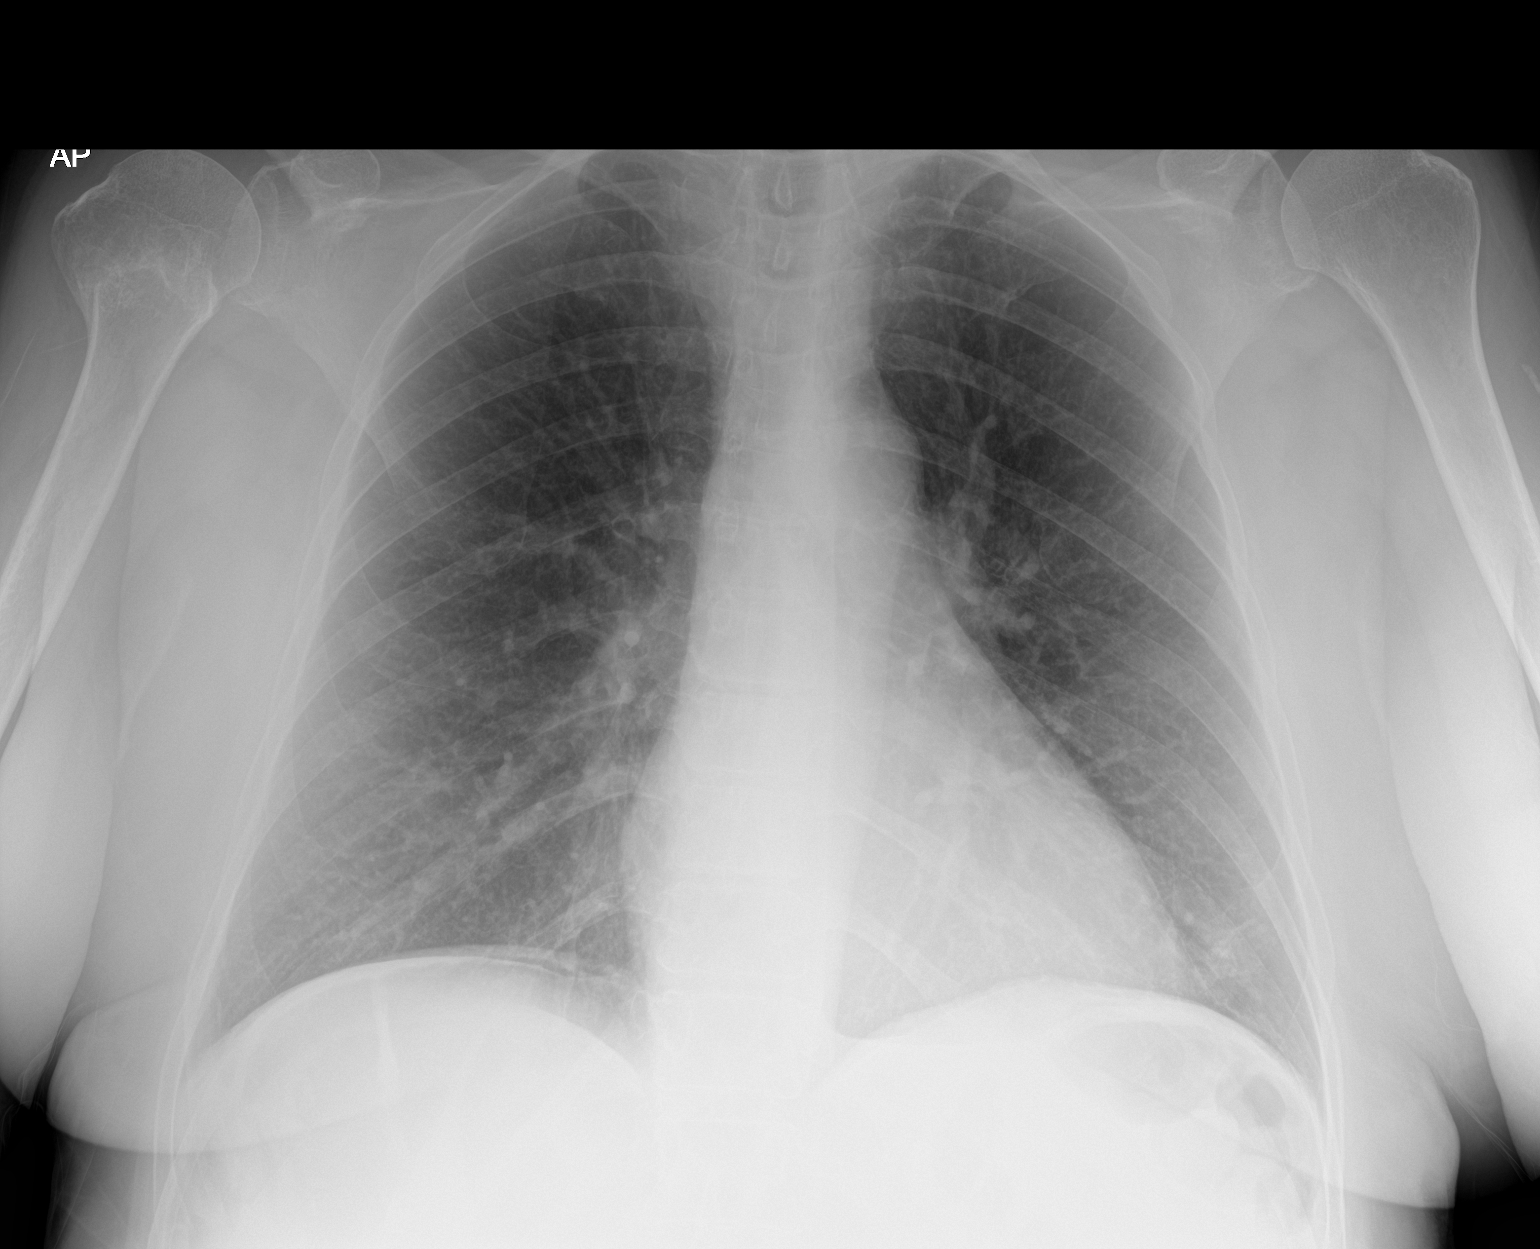

[1 of 1 positions shown; findings below may reference images not displayed]

FINDINGS: The heart size and mediastinal contours are within normal limits.
Both lungs are clear. The visualized skeletal structures are
unremarkable.
IMPRESSION: No active disease.
